# Patient Record
Sex: Male | Born: 1985 | Race: Black or African American | Hispanic: No | Marital: Single | Smoking: Current every day smoker
Health system: Southern US, Community
[De-identification: ages and names within clinical notes are randomized; demographics above are authoritative.]

## PROBLEM LIST (undated history)

## (undated) DIAGNOSIS — R569 Unspecified convulsions: Secondary | ICD-10-CM

## (undated) HISTORY — DX: Unspecified convulsions: R56.9

## (undated) HISTORY — PX: FRACTURE SURGERY: SHX138

---
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
## 2009-07-07 ENCOUNTER — Emergency Department (HOSPITAL_COMMUNITY): Admission: EM | Admit: 2009-07-07 | Discharge: 2009-07-07 | Payer: Self-pay | Admitting: Emergency Medicine

## 2009-07-09 ENCOUNTER — Emergency Department (HOSPITAL_COMMUNITY): Admission: EM | Admit: 2009-07-09 | Discharge: 2009-07-10 | Payer: Self-pay | Admitting: Emergency Medicine

## 2009-07-15 ENCOUNTER — Emergency Department (HOSPITAL_COMMUNITY): Admission: EM | Admit: 2009-07-15 | Discharge: 2009-07-15 | Payer: Self-pay | Admitting: Family Medicine

## 2009-07-29 ENCOUNTER — Emergency Department (HOSPITAL_COMMUNITY): Admission: EM | Admit: 2009-07-29 | Discharge: 2009-07-29 | Payer: Self-pay | Admitting: Emergency Medicine

## 2009-08-09 ENCOUNTER — Encounter: Admission: RE | Admit: 2009-08-09 | Discharge: 2009-08-09 | Payer: Self-pay | Admitting: Neurology

## 2010-08-10 LAB — POCT I-STAT, CHEM 8
Chloride: 108 mEq/L (ref 96–112)
Creatinine, Ser: 1.3 mg/dL (ref 0.4–1.5)
HCT: 45 % (ref 39.0–52.0)
TCO2: 20 mmol/L (ref 0–100)

## 2010-08-15 LAB — POCT I-STAT, CHEM 8
BUN: 12 mg/dL (ref 6–23)
Glucose, Bld: 66 mg/dL — ABNORMAL LOW (ref 70–99)
HCT: 48 % (ref 39.0–52.0)
Hemoglobin: 16.3 g/dL (ref 13.0–17.0)

## 2010-08-15 LAB — RAPID URINE DRUG SCREEN, HOSP PERFORMED
Barbiturates: NOT DETECTED
Cocaine: NOT DETECTED
Opiates: NOT DETECTED
Tetrahydrocannabinol: POSITIVE — AB

## 2010-10-07 NOTE — Op Note (Signed)
[place]  Patient:    HENDRICK, PAVICH Visit Number: [id number] MRN: [id number]          Service Type: MED Location: 1800 [id number] Attending Physician:  Tobey Bride Dictated by:   Artist Pais Mina Marble, M.D. Proc. Date: 06/05/01 Admit Date:  06/05/2001                             Operative Report  PREOPERATIVE DIAGNOSIS:  Fracture dislocation, right elbow.  POSTOPERATIVE DIAGNOSIS:  Fracture dislocation, right elbow.  PROCEDURES:  Open reduction and internal fixation of fracture dislocation of the right elbow with fixation of medial epicondylar fragment with two 2.7 mm mini fragment screws 24 mm in length.  SURGEONS:  Artist Pais. Mina Marble, M.D., and Elana Alm. Thurston Hole, M.D.  ANESTHESIA:  General.  TOURNIQUET TIME:  38 minutes.  COMPLICATIONS:  None.  DRAINS:  None.  DESCRIPTION OF PROCEDURE:  The patient was taken to the operating room.  After the induction of adequate general anesthesia, the right upper extremity was prepped and draped in the usual sterile fashion.  An Esmarch was used to exsanguinate the limb and the tourniquet was inflated to 250 mmHg.  At this point in time, a 6-7 mm incision was made over the medial aspect of the right elbow centered between the olecranon process and the medial epicondyle.  The incision was taken down through the skin and subcutaneous tissues with care to carefully identify and retract a large branch of the medial endobrachial cutaneous nerve.  Once this was done, the ulnar nerve was identified.  After this was done, the joint was entered with blunt dissection.  The capsule had been stripped off of the medial side of the elbow and there was a large medial epicondylar fragment with the collateral ligament attached to it interposed at the joint.  Gentle reduction was undertaken and the fragment was reduced out of the joint.  At this point in time, the joint was thoroughly irrigated of clot and  hematoma.  Once this was done, the medial epicondyle was provisonally fixed to the shaft of the distal humerus using an 0.45 K-wire.  The ulnar nerve was retracted during the entire process.  Once this was done, intraoperative x-rays showed good reduction in both the AP lateral and oblique views.  Next, a 2.7 mm mini fragment screw was placed just posterior to the 0.45 K-wire, 24 mm in length, drilled under direct vision.  The 0.45 K-wire was then removed and a second screw was placed in the same hole. Intraoperative x-rays then showed good reduction of the medial and epicondylar fragment in both the AP lateral and oblique views.  The wound was then thoroughly irrigated.  It was closed in layers with 2-0 Vicryl and then staples on the skin.  A sterile dressing of Xeroform, 4 x 4s, fluffs, compressive dressing, and elbow splint with the forearm in neutral, the elbow at 90 degrees, and the wrist in slight flexion, was applied.  The patient tolerated the procedure well and went to the recovery room in stable fashion. Dictated by:   Artist Pais Mina Marble, M.D. Attending Physician:  Tobey Bride DD:  06/05/01 TD:  06/05/01 Job: [id number]

## 2012-06-17 ENCOUNTER — Emergency Department (INDEPENDENT_AMBULATORY_CARE_PROVIDER_SITE_OTHER): Payer: 59

## 2012-06-17 ENCOUNTER — Emergency Department (INDEPENDENT_AMBULATORY_CARE_PROVIDER_SITE_OTHER)
Admission: EM | Admit: 2012-06-17 | Discharge: 2012-06-17 | Disposition: A | Discharge status: Home / Self Care | Payer: 59 | Source: Home / Self Care | Attending: Family Medicine | Admitting: Family Medicine

## 2012-06-17 ENCOUNTER — Encounter (HOSPITAL_COMMUNITY): Payer: Self-pay

## 2012-06-17 DIAGNOSIS — S62329A Displaced fracture of shaft of unspecified metacarpal bone, initial encounter for closed fracture: Secondary | ICD-10-CM

## 2012-06-17 DIAGNOSIS — S62326A Displaced fracture of shaft of fifth metacarpal bone, right hand, initial encounter for closed fracture: Secondary | ICD-10-CM

## 2012-06-17 MED ORDER — HYDROCODONE-ACETAMINOPHEN 5-500 MG PO TABS: 1.0000 | ORAL_TABLET | Freq: Four times a day (QID) | ORAL | Status: AC | PRN

## 2012-06-17 MED ORDER — IBUPROFEN 600 MG PO TABS: 600.0000 mg | ORAL_TABLET | Freq: Three times a day (TID) | ORAL | Status: DC | PRN

## 2012-06-17 MED ORDER — IBUPROFEN 800 MG PO TABS
ORAL_TABLET | ORAL | Status: AC
  Filled 2012-06-17: qty 1

## 2012-06-17 MED ORDER — HYDROCODONE-ACETAMINOPHEN 5-325 MG PO TABS
ORAL_TABLET | ORAL | Status: AC
  Filled 2012-06-17: qty 1

## 2012-06-17 MED ORDER — HYDROCODONE-ACETAMINOPHEN 5-325 MG PO TABS
1.0000 | ORAL_TABLET | Freq: Once | ORAL | Status: AC
  Administered 2012-06-17: 1 via ORAL

## 2012-06-17 MED ORDER — IBUPROFEN 800 MG PO TABS
800.0000 mg | ORAL_TABLET | Freq: Once | ORAL | Status: AC
  Administered 2012-06-17: 800 mg via ORAL

## 2012-06-17 NOTE — ED Notes (Signed)
States he struck right hand in wall when moving a friend

## 2012-06-17 NOTE — ED Provider Notes (Signed)
History     CSN: [id number]  Arrival date & time 06/17/12  1031   First MD Initiated Contact with Patient 06/17/12 1106      Chief Complaint  Patient presents with  . Hand Injury    (Consider location/radiation/quality/duration/timing/severity/associated sxs/prior treatment) HPI Comments: 27 year old right-handed male. Works at [employer] as a Merchandiser, retail. Here complaining of swelling and pain of the right hand. Patient reports that he sustained an injury 4 days ago when he was helping a friend to move and had his "right hand compressed between a refrigerator and a wall". Denies fist punching a wall a person or an object. Pain and swelling not improving reason he decided to come today to have his hand checked. Denies numbness, weakness or paresthesias in the right hand.   History reviewed. No pertinent past medical history.  History reviewed. No pertinent past surgical history.  History reviewed. No pertinent family history.  History  Substance Use Topics  . Smoking status: Not on file  . Smokeless tobacco: Not on file  . Alcohol Use: Not on file      Review of Systems  Musculoskeletal:       Right hand injury as per history of present illness  All other systems reviewed and are negative.    Allergies  Penicillins  Home Medications   Current Outpatient Rx  Name  Route  Sig  Dispense  Refill  . HYDROCODONE-ACETAMINOPHEN 5-500 MG PO TABS   Oral   Take 1 tablet by mouth every 6 (six) hours as needed for pain.   15 tablet   0   . IBUPROFEN 600 MG PO TABS   Oral   Take 1 tablet (600 mg total) by mouth every 8 (eight) hours as needed for pain.   20 tablet   0     BP 144/84  Pulse 62  Temp 97.9 F (36.6 C) (Oral)  Resp 16  SpO2 100%  Physical Exam  Nursing note and vitals reviewed. Constitutional: He is oriented to person, place, and time. He appears well-developed and well-nourished. No distress.  HENT:       No evidence of facial trauma.  Cardiovascular:  Normal heart sounds.   Pulmonary/Chest: Breath sounds normal.  Musculoskeletal:       Right hand: There is moderate swelling dorsally and laterally over the fifth metacarpal area. Area is also tender to palpation. Patient able to flex and extend fifth MP with limited range of motion with reported discomfort over metacarpal area. Patient can flex and extend PIP and PIP joints with no difficulties.  No obvious hematoma. Skin appears intact over fifth metacarpal area and fifth finger. There is a small superficial abrasion over third MP joint. Intact superficial sensation. Ulnar and radial pulses are also intact and patient has a brisk capillary refill in all digital pad including the fifth.    Neurological: He is alert and oriented to person, place, and time.  Skin: He is not diaphoretic.    ED Course  Procedures (including critical care time)  Labs Reviewed - No data to display Dg Hand Complete Right  06/17/2012  *RADIOLOGY REPORT*  Clinical Data: Persistent ulnar hand pain following injury 4 days ago.  RIGHT HAND - COMPLETE 3+ VIEW  Comparison: None.  Findings: There is a transverse fracture through the mid fifth metacarpal diaphysis associated with mild displacement and mild apex dorsal angulation.  There is no intra-articular extension.  No other fractures are identified.  There is no dislocation.  IMPRESSION: Mildly  displaced and angulated fracture of the mid fifth metacarpal.   Original Report Authenticated By: Carey Bullocks, M.D.      1. Closed displaced fracture of shaft of fifth metacarpal bone of right hand       MDM  27 year old male with right fifth mild angulated displaced metacarpal fracture from an injury that occurred 4 days ago.  Patient was placed in an ulnar gutter cast splint. Contacted Dr. Lenora Boys office (hand on call today). Patient will be seen next Thursday patient is to call to establish time of his appointment. Prescribed hydrocodone/acetaminophen and  ibuprofen. Supportive care and red flags should prompt his prompt return to medical attention discussed with patient and provided in writing.     Sharin Grave, MD 06/19/12 1209

## 2012-06-17 NOTE — ED Notes (Signed)
Ortho tech paged  

## 2012-06-17 NOTE — Progress Notes (Signed)
Orthopedic Tech Progress Note Patient Details:  Luke Taylor [birth year] [id number] Ulna gutter splint applied to Right UE with care instructions; tolerated well. Arm sling also supplied to patient and applied.   Ortho Devices Type of Ortho Device: Arm sling;Ulna gutter splint Ortho Device/Splint Location: Right UE Ortho Device/Splint Interventions: Application   Asia R Thompson 06/17/2012, 1:42 PM

## 2013-08-11 ENCOUNTER — Encounter (HOSPITAL_COMMUNITY): Payer: Self-pay | Admitting: Emergency Medicine

## 2013-08-11 ENCOUNTER — Emergency Department (INDEPENDENT_AMBULATORY_CARE_PROVIDER_SITE_OTHER)
Admission: EM | Admit: 2013-08-11 | Discharge: 2013-08-11 | Disposition: A | Discharge status: Home / Self Care | Payer: 59 | Source: Home / Self Care | Attending: Family Medicine | Admitting: Family Medicine

## 2013-08-11 DIAGNOSIS — R040 Epistaxis: Secondary | ICD-10-CM

## 2013-08-11 MED ORDER — CETIRIZINE HCL 10 MG PO TABS: 10.0000 mg | ORAL_TABLET | Freq: Every day | ORAL | Status: DC

## 2013-08-11 NOTE — Discharge Instructions (Signed)
Use medicine as instructed, return as needed if problem recurs

## 2013-08-11 NOTE — ED Notes (Signed)
C/o episodic nosebleed x 2-3 days, none at present

## 2013-08-11 NOTE — ED Provider Notes (Addendum)
CSN: [id number]     Arrival date & time 08/11/13  1225 History   None    Chief Complaint  Patient presents with  . Epistaxis   (Consider location/radiation/quality/duration/timing/severity/associated sxs/prior Treatment) Patient is a 28 y.o. male presenting with nosebleeds.  Epistaxis Location:  L nare Severity:  Mild Duration:  3 days Timing:  Intermittent Progression:  Waxing and waning Chronicity:  Recurrent Context: weather change   Associated symptoms: congestion   Risk factors: allergies and frequent nosebleeds     History reviewed. No pertinent past medical history. History reviewed. No pertinent past surgical history. History reviewed. No pertinent family history. History  Substance Use Topics  . Smoking status: Never Smoker   . Smokeless tobacco: Not on file  . Alcohol Use: Not on file    Review of Systems  Constitutional: Negative.   HENT: Positive for congestion and nosebleeds. Negative for postnasal drip and rhinorrhea.     Allergies  Penicillins  Home Medications   Current Outpatient Rx  Name  Route  Sig  Dispense  Refill  . cetirizine (ZYRTEC) 10 MG tablet   Oral   Take 1 tablet (10 mg total) by mouth daily. One tab daily for allergies   30 tablet   1   . HYDROcodone-acetaminophen (VICODIN) 5-500 MG per tablet   Oral   Take 1 tablet by mouth every 6 (six) hours as needed for pain.   15 tablet   0   . ibuprofen (ADVIL,MOTRIN) 600 MG tablet   Oral   Take 1 tablet (600 mg total) by mouth every 8 (eight) hours as needed for pain.   20 tablet   0    BP 137/84  Pulse 60  Temp(Src) 97.8 F (36.6 C) (Oral)  Resp 14  SpO2 100% Physical Exam  Nursing note and vitals reviewed. Constitutional: He is oriented to person, place, and time. He appears well-developed and well-nourished.  HENT:  Head: Normocephalic.  Right Ear: External ear normal.  Left Ear: External ear normal.  Nose: No mucosal edema, rhinorrhea or nasal septal hematoma. No  epistaxis.  Mouth/Throat: Oropharynx is clear and moist.  Neck: Normal range of motion. Neck supple.  Lymphadenopathy:    He has no cervical adenopathy.  Neurological: He is alert and oriented to person, place, and time.  Skin: Skin is warm and dry.    ED Course  Procedures (including critical care time) Labs Review Labs Reviewed - No data to display Imaging Review No results found.   MDM   1. Left-sided epistaxis    Afrin spray and bacitracin applied to nose.   Linna HoffJames D Lynnell Fiumara, MD 08/11/13 1444  Linna HoffJames D Benjamyn Hestand, MD 08/12/13 [phone]

## 2016-06-06 ENCOUNTER — Ambulatory Visit (INDEPENDENT_AMBULATORY_CARE_PROVIDER_SITE_OTHER): Payer: BLUE CROSS/BLUE SHIELD | Admitting: Physician Assistant

## 2016-06-06 VITALS — BP 136/82 | HR 68 | Temp 98.4°F | Resp 18 | Ht 70.0 in | Wt 180.0 lb

## 2016-06-06 DIAGNOSIS — R5383 Other fatigue: Secondary | ICD-10-CM | POA: Diagnosis not present

## 2016-06-06 DIAGNOSIS — R9431 Abnormal electrocardiogram [ECG] [EKG]: Secondary | ICD-10-CM

## 2016-06-06 DIAGNOSIS — Z87898 Personal history of other specified conditions: Secondary | ICD-10-CM

## 2016-06-06 DIAGNOSIS — R42 Dizziness and giddiness: Secondary | ICD-10-CM | POA: Diagnosis not present

## 2016-06-06 LAB — POC MICROSCOPIC URINALYSIS (UMFC): Mucus: ABSENT

## 2016-06-06 LAB — POCT URINALYSIS DIP (MANUAL ENTRY)
Blood, UA: NEGATIVE
Glucose, UA: NEGATIVE
Leukocytes, UA: NEGATIVE
Nitrite, UA: NEGATIVE
Protein Ur, POC: 30 — AB
Spec Grav, UA: >=1.03
Urobilinogen, UA: 2
pH, UA: 6

## 2016-06-06 NOTE — Progress Notes (Signed)
Luke Taylor  MRN: [id number] DOB: Jul 10, 1985  PCP: No primary care provider on file.  Subjective:  Pt is a 31 year old male who presents to clinic for dizziness and fatigue.  He got dizzy at work today and felt numbness in his legs. He took his blood pressure, 178/93.  Dizziness - He was not doing any physical labor. His sight was "a little dim". He has not eating anything at the time. He normally does not eat breakfast, but usually eats lunch. "felt like I was about to fall over."  Denies being sick recently. No fever, chills No new medications.  Drinks about 4 20-oz bottles of water a day. Didn't sleep well last night - he was up talking with his girlfriend until 1am. Woke up at 5 am.  Right now he is feeling better. Feels a little weak.  Denies chest pain, palpitations, headache.   Blood pressure today in office 136/82.  History of seizure - One episode in 2011. Does not take any medications for this. At the time he was taking energy drinks and not sleeping. He saw neurologist for about 6 months. Has not had seizure since that time.   Recent life stressor - Father recently passed away due to complications of alcohol and seizure medications.  Family history of diabetes - maternal grandfather.   Review of Systems  Constitutional: Positive for fatigue. Negative for chills, diaphoresis and fever.  Respiratory: Negative for cough, chest tightness, shortness of breath and wheezing.   Cardiovascular: Negative for chest pain, palpitations and leg swelling.  Gastrointestinal: Negative for diarrhea, nausea and vomiting.  Musculoskeletal: Negative for neck pain.  Neurological: Positive for dizziness. Negative for syncope, light-headedness and headaches.  Psychiatric/Behavioral: Negative for sleep disturbance. The patient is not nervous/anxious.     There are no active problems to display for this patient.   Current Outpatient Prescriptions on File Prior to Visit  Medication Sig  Dispense Refill  . HYDROcodone-acetaminophen (VICODIN) 5-500 MG per tablet Take 1 tablet by mouth every 6 (six) hours as needed for pain. (Patient not taking: Reported on 06/06/2016) 15 tablet 0   No current facility-administered medications on file prior to visit.     Allergies  Allergen Reactions  . Penicillins      Objective:  BP 136/82 (BP Location: Right Arm, Patient Position: Sitting, Cuff Size: Small)   Pulse 68   Temp 98.4 F (36.9 C) (Oral)   Resp 18   Ht _0  (1.778 m)   Wt 180 lb (81.6 kg)   SpO2 99%   BMI 25.83 kg/m   Physical Exam  Constitutional: He is oriented to person, place, and time and well-developed, well-nourished, and in no distress. No distress.  Eyes: Conjunctivae are normal. Pupils are equal, round, and reactive to light. Right eye exhibits nystagmus. Left eye exhibits nystagmus.  Cardiovascular: Normal rate, regular rhythm and normal heart sounds.   No murmur heard. Pulmonary/Chest: Effort normal and breath sounds normal.  Neurological: He is alert and oriented to person, place, and time. He has normal motor skills, normal sensation, normal strength and normal reflexes. No sensory deficit. GCS score is 15.  Skin: Skin is warm and dry.  Psychiatric: Mood, memory, affect and judgment normal.  Vitals reviewed.  EKG:  short P-R interval.  Assessment and Plan :  This case was discussed with Dr. Carlota Raspberry.  1. Dizziness 2. Fatigue, unspecified type 3. Nonspecific abnormal electrocardiogram (ECG) (EKG) - TSH - EKG 12-Lead - CMP14+EGFR - POCT urinalysis  dipstick - POCT Microscopic Urinalysis ([facility]) - Hemoglobin A1c - CBC with Differential/Platelet - Ambulatory referral to Cardiology  4. History of seizure - Ambulatory referral to Neurology - Suspect possible situational dizziness due to dehydration, lack of sleep and increased family stress. No concerning findings on physical exam. Due to pt's abnormal EKG and history of seizure, will refer for  further work-up. Discussed this with pt. Discussed healthy lifestyle and food choices. He understands and agrees.     Mercer Pod, PA-C  [facility] 06/06/2016 3:05 PM

## 2016-06-06 NOTE — Patient Instructions (Signed)
Your urine shows that you are dehydrated.  Please drink at least 2-3 liters of water a day. Cut down on the amount of sugary drinks you have. Please try to eat a healthy diet and bring snacks with you. Eat several small meals a day.   Cardiology and neurology will call you to set up an appointment.  Please call the clinic if you have any questions. I will mail you your labs results when they come back.   Thank you for coming in today. I hope you feel we met your needs.  Feel free to call [facility] if you have any questions or further requests.  Please consider signing up for MyChart if you do not already have it, as this is a great way to communicate with me.  Best,  Whitney Saladin Petrelli, PA-C   Heart-Healthy Eating Plan Many factors influence your heart health, including eating and exercise habits. Heart (coronary) risk increases with abnormal blood fat (lipid) levels. Heart-healthy meal planning includes limiting unhealthy fats, increasing healthy fats, and making other small dietary changes. This includes maintaining a healthy body weight to help keep lipid levels within a normal range. What is my plan? Your health care provider recommends that you:  Get no more than _________% of the total calories in your daily diet from fat.  Limit your intake of saturated fat to less than _________% of your total calories each day.  Limit the amount of cholesterol in your diet to less than _________ mg per day. What types of fat should I choose?  Choose healthy fats more often. Choose monounsaturated and polyunsaturated fats, such as olive oil and canola oil, flaxseeds, walnuts, almonds, and seeds.  Eat more omega-3 fats. Good choices include salmon, mackerel, sardines, tuna, flaxseed oil, and ground flaxseeds. Aim to eat fish at least two times each week.  Limit saturated fats. Saturated fats are primarily found in animal products, such as meats, butter, and cream. Plant sources of saturated fats include  palm oil, palm kernel oil, and coconut oil.  Avoid foods with partially hydrogenated oils in them. These contain trans fats. Examples of foods that contain trans fats are stick margarine, some tub margarines, cookies, crackers, and other baked goods. What general guidelines do I need to follow?  Check food labels carefully to identify foods with trans fats or high amounts of saturated fat.  Fill one half of your plate with vegetables and green salads. Eat 4-5 servings of vegetables per day. A serving of vegetables equals 1 cup of raw leafy vegetables,  cup of raw or cooked cut-up vegetables, or  cup of vegetable juice.  Fill one fourth of your plate with whole grains. Look for the word "whole" as the first word in the ingredient list.  Fill one fourth of your plate with lean protein foods.  Eat 4-5 servings of fruit per day. A serving of fruit equals one medium whole fruit,  cup of dried fruit,  cup of fresh, frozen, or canned fruit, or  cup of 100% fruit juice.  Eat more foods that contain soluble fiber. Examples of foods that contain this type of fiber are apples, broccoli, carrots, beans, peas, and barley. Aim to get 20-30 g of fiber per day.  Eat more home-cooked food and less restaurant, buffet, and fast food.  Limit or avoid alcohol.  Limit foods that are high in starch and sugar.  Avoid fried foods.  Cook foods by using methods other than frying. Baking, boiling, grilling, and broiling are  all great options. Other fat-reducing suggestions include:  Removing the skin from poultry.  Removing all visible fats from meats.  Skimming the fat off of stews, soups, and gravies before serving them.  Steaming vegetables in water or broth.  Lose weight if you are overweight. Losing just 5-10% of your initial body weight can help your overall health and prevent diseases such as diabetes and heart disease.  Increase your consumption of nuts, legumes, and seeds to 4-5 servings per  week. One serving of dried beans or legumes equals  cup after being cooked, one serving of nuts equals 1 ounces, and one serving of seeds equals  ounce or 1 tablespoon.  You may need to monitor your salt (sodium) intake, especially if you have high blood pressure. Talk with your health care provider or dietitian to get more information about reducing sodium. What foods can I eat? Grains  Breads, including [place], white, pita, wheat, raisin, rye, oatmeal, and [place]. Tortillas that are neither fried nor made with lard or trans fat. Low-fat rolls, including hotdog and hamburger buns and English muffins. Biscuits. Muffins. Waffles. Pancakes. Light popcorn. Whole-grain cereals. Flatbread. Melba toast. Pretzels. Breadsticks. Rusks. Low-fat snacks and crackers, including oyster, saltine, matzo, graham, animal, and rye. Rice and pasta, including brown rice and those that are made with whole wheat. Vegetables  All vegetables. Fruits  All fruits, but limit coconut. Meats and Other Protein Sources  Lean, well-trimmed beef, veal, pork, and lamb. Chicken and [place] without skin. All fish and shellfish. Wild duck, rabbit, pheasant, and venison. Egg whites or low-cholesterol egg substitutes. Dried beans, peas, lentils, and tofu.Seeds and most nuts. Dairy  Low-fat or nonfat cheeses, including ricotta, string, and mozzarella. Skim or 1% milk that is liquid, powdered, or evaporated. Buttermilk that is made with low-fat milk. Nonfat or low-fat yogurt. Beverages  Mineral water. Diet carbonated beverages. Sweets and Desserts  Sherbets and fruit ices. Honey, jam, marmalade, jelly, and syrups. Meringues and gelatins. Pure sugar candy, such as hard candy, jelly beans, gumdrops, mints, marshmallows, and small amounts of dark chocolate. [employer] Eat all sweets and desserts in moderation. Fats and Oils  Nonhydrogenated (trans-free) margarines. Vegetable oils, including soybean, sesame, sunflower, olive,  peanut, safflower, corn, canola, and cottonseed. Salad dressings or mayonnaise that are made with a vegetable oil. Limit added fats and oils that you use for cooking, baking, salads, and as spreads. Other  Cocoa powder. Coffee and tea. All seasonings and condiments. The items listed above may not be a complete list of recommended foods or beverages. Contact your dietitian for more options.  What foods are not recommended? Grains  Breads that are made with saturated or trans fats, oils, or whole milk. Croissants. Butter rolls. Cheese breads. Sweet rolls. Donuts. Buttered popcorn. Chow mein noodles. High-fat crackers, such as cheese or butter crackers. Meats and Other Protein Sources  Fatty meats, such as hotdogs, short ribs, sausage, spareribs, bacon, ribeye roast or steak, and mutton. High-fat deli meats, such as salami and bologna. Caviar. Domestic duck and goose. Organ meats, such as kidney, liver, sweetbreads, brains, gizzard, chitterlings, and heart. Dairy  Cream, sour cream, cream cheese, and creamed cottage cheese. Whole milk cheeses, including blue (bleu), Monte[place] Jack, [place], [place], American, [place], Swiss, [place], [place], and [place]. Whole or 2% milk that is liquid, evaporated, or condensed. Whole buttermilk. Cream sauce or high-fat cheese sauce. Yogurt that is made from whole milk. Beverages  Regular sodas and drinks with added sugar. Sweets and Desserts  Frosting.  Pudding. Cookies. Cakes other than angel food cake. Candy that has milk chocolate or white chocolate, hydrogenated fat, butter, coconut, or unknown ingredients. Buttered syrups. Full-fat ice cream or ice cream drinks. Fats and Oils  Gravy that has suet, meat fat, or shortening. Cocoa butter, hydrogenated oils, palm oil, coconut oil, palm kernel oil. These can often be found in baked products, candy, fried foods, nondairy creamers, and whipped toppings. Solid fats and shortenings, including bacon fat, salt pork, lard, and  butter. Nondairy cream substitutes, such as coffee creamers and sour cream substitutes. Salad dressings that are made of unknown oils, cheese, or sour cream. The items listed above may not be a complete list of foods and beverages to avoid. Contact your dietitian for more information.  This information is not intended to replace advice given to you by your health care provider. Make sure you discuss any questions you have with your health care provider. Document Released: 02/15/2008 Document Revised: 11/26/2015 Document Reviewed: 10/30/2013 Elsevier Interactive Patient Education  2017 [employer].

## 2016-06-07 LAB — CBC WITH DIFFERENTIAL/PLATELET
Basophils Absolute: 0 10*3/uL (ref 0.0–0.2)
Basos: 0 %
EOS (ABSOLUTE): 0.1 10*3/uL (ref 0.0–0.4)
Eos: 1 %
Hematocrit: 43.6 % (ref 37.5–51.0)
Hemoglobin: 14.7 g/dL (ref 13.0–17.7)
Immature Grans (Abs): 0 10*3/uL (ref 0.0–0.1)
Immature Granulocytes: 0 %
Lymphocytes Absolute: 1.8 10*3/uL (ref 0.7–3.1)
Lymphs: 37 %
MCH: 27.5 pg (ref 26.6–33.0)
MCHC: 33.7 g/dL (ref 31.5–35.7)
MCV: 82 fL (ref 79–97)
Monocytes Absolute: 0.5 10*3/uL (ref 0.1–0.9)
Monocytes: 10 %
Neutrophils Absolute: 2.6 10*3/uL (ref 1.4–7.0)
Neutrophils: 52 %
Platelets: 344 10*3/uL (ref 150–379)
RBC: 5.34 x10E6/uL (ref 4.14–5.80)
RDW: 14.4 % (ref 12.3–15.4)
WBC: 4.9 10*3/uL (ref 3.4–10.8)

## 2016-06-07 LAB — CMP14+EGFR
ALT: 19 IU/L (ref 0–44)
AST: 21 IU/L (ref 0–40)
Albumin/Globulin Ratio: 2.1 (ref 1.2–2.2)
Albumin: 5 g/dL (ref 3.5–5.5)
Alkaline Phosphatase: 74 IU/L (ref 39–117)
BUN/Creatinine Ratio: 13 (ref 9–20)
BUN: 16 mg/dL (ref 6–20)
Bilirubin Total: 1.2 mg/dL (ref 0.0–1.2)
CO2: 23 mmol/L (ref 18–29)
Calcium: 9.5 mg/dL (ref 8.7–10.2)
Chloride: 103 mmol/L (ref 96–106)
Creatinine, Ser: 1.19 mg/dL (ref 0.76–1.27)
GFR calc Af Amer: 95 mL/min/{1.73_m2} (ref >59)
GFR calc non Af Amer: 82 mL/min/{1.73_m2} (ref >59)
Globulin, Total: 2.4 g/dL (ref 1.5–4.5)
Glucose: 74 mg/dL (ref 65–99)
Potassium: 4.4 mmol/L (ref 3.5–5.2)
Sodium: 145 mmol/L — ABNORMAL HIGH (ref 134–144)
Total Protein: 7.4 g/dL (ref 6.0–8.5)

## 2016-06-07 LAB — HEMOGLOBIN A1C
Est. average glucose Bld gHb Est-mCnc: 100 mg/dL
Hgb A1c MFr Bld: 5.1 % (ref 4.8–5.6)

## 2016-06-07 LAB — TSH: TSH: 0.972 u[IU]/mL (ref 0.450–4.500)

## 2016-06-14 ENCOUNTER — Encounter: Payer: Self-pay | Admitting: Physician Assistant

## 2017-01-01 ENCOUNTER — Encounter (HOSPITAL_COMMUNITY): Payer: Self-pay | Admitting: Emergency Medicine

## 2017-01-01 ENCOUNTER — Emergency Department (HOSPITAL_COMMUNITY)
Admission: EM | Admit: 2017-01-01 | Discharge: 2017-01-01 | Disposition: A | Discharge status: Home / Self Care | Payer: BLUE CROSS/BLUE SHIELD | Attending: Emergency Medicine | Admitting: Emergency Medicine

## 2017-01-01 ENCOUNTER — Emergency Department (HOSPITAL_COMMUNITY): Payer: BLUE CROSS/BLUE SHIELD

## 2017-01-01 DIAGNOSIS — R0602 Shortness of breath: Secondary | ICD-10-CM | POA: Insufficient documentation

## 2017-01-01 DIAGNOSIS — M545 Low back pain: Secondary | ICD-10-CM | POA: Diagnosis present

## 2017-01-01 DIAGNOSIS — R05 Cough: Secondary | ICD-10-CM | POA: Diagnosis not present

## 2017-01-01 DIAGNOSIS — F17200 Nicotine dependence, unspecified, uncomplicated: Secondary | ICD-10-CM | POA: Insufficient documentation

## 2017-01-01 DIAGNOSIS — M62838 Other muscle spasm: Secondary | ICD-10-CM

## 2017-01-01 LAB — CBC WITH DIFFERENTIAL/PLATELET
Basophils Absolute: 0 10*3/uL (ref 0.0–0.1)
Basophils Relative: 0 %
EOS PCT: 1 %
Eosinophils Absolute: 0.1 10*3/uL (ref 0.0–0.7)
HEMATOCRIT: 42.3 % (ref 39.0–52.0)
Hemoglobin: 14.6 g/dL (ref 13.0–17.0)
LYMPHS ABS: 1.2 10*3/uL (ref 0.7–4.0)
LYMPHS PCT: 13 %
MCH: 27.8 pg (ref 26.0–34.0)
MCHC: 34.5 g/dL (ref 30.0–36.0)
MCV: 80.4 fL (ref 78.0–100.0)
MONO ABS: 0.3 10*3/uL (ref 0.1–1.0)
Monocytes Relative: 3 %
NEUTROS ABS: 7.4 10*3/uL (ref 1.7–7.7)
Neutrophils Relative %: 83 %
PLATELETS: 281 10*3/uL (ref 150–400)
RBC: 5.26 MIL/uL (ref 4.22–5.81)
RDW: 13.9 % (ref 11.5–15.5)
WBC: 9 10*3/uL (ref 4.0–10.5)

## 2017-01-01 LAB — D-DIMER, QUANTITATIVE: D-Dimer, Quant: <0.27 ug/mL-FEU (ref 0.00–0.50)

## 2017-01-01 LAB — BASIC METABOLIC PANEL
Anion gap: 8 (ref 5–15)
BUN: 9 mg/dL (ref 6–20)
CO2: 26 mmol/L (ref 22–32)
Calcium: 9.3 mg/dL (ref 8.9–10.3)
Chloride: 106 mmol/L (ref 101–111)
Creatinine, Ser: 1.21 mg/dL (ref 0.61–1.24)
GFR calc Af Amer: >60 mL/min (ref >60)
Glucose, Bld: 87 mg/dL (ref 65–99)
POTASSIUM: 4.2 mmol/L (ref 3.5–5.1)
Sodium: 140 mmol/L (ref 135–145)

## 2017-01-01 LAB — URINALYSIS, ROUTINE W REFLEX MICROSCOPIC
BILIRUBIN URINE: NEGATIVE
Bacteria, UA: NONE SEEN
GLUCOSE, UA: NEGATIVE mg/dL
HGB URINE DIPSTICK: NEGATIVE
Ketones, ur: NEGATIVE mg/dL
Leukocytes, UA: NEGATIVE
NITRITE: NEGATIVE
PH: 9 — AB (ref 5.0–8.0)
Protein, ur: 30 mg/dL — AB
SPECIFIC GRAVITY, URINE: 1.018 (ref 1.005–1.030)
Squamous Epithelial / LPF: NONE SEEN

## 2017-01-01 LAB — TROPONIN I: Troponin I: <0.03 ng/mL (ref <0.03)

## 2017-01-01 MED ORDER — CYCLOBENZAPRINE HCL 5 MG PO TABS: 5.0000 mg | ORAL_TABLET | Freq: Two times a day (BID) | ORAL | 0 refills | Status: AC | PRN

## 2017-01-01 MED ORDER — IBUPROFEN 800 MG PO TABS: 800.0000 mg | ORAL_TABLET | Freq: Three times a day (TID) | ORAL | 0 refills | Status: AC

## 2017-01-01 MED ORDER — DIAZEPAM 5 MG PO TABS
5.0000 mg | ORAL_TABLET | Freq: Once | ORAL | Status: AC
  Administered 2017-01-01: 5 mg via ORAL
  Filled 2017-01-01: qty 1

## 2017-01-01 MED ORDER — HYDROCODONE-ACETAMINOPHEN 5-325 MG PO TABS
1.0000 | ORAL_TABLET | Freq: Once | ORAL | Status: AC
  Administered 2017-01-01: 1 via ORAL
  Filled 2017-01-01: qty 1

## 2017-01-01 MED ORDER — KETOROLAC TROMETHAMINE 30 MG/ML IJ SOLN
30.0000 mg | Freq: Once | INTRAMUSCULAR | Status: AC
  Administered 2017-01-01: 30 mg via INTRAMUSCULAR
  Filled 2017-01-01: qty 1

## 2017-01-01 NOTE — ED Provider Notes (Signed)
[facility]-EMERGENCY DEPT Provider Note   CSN: [id number] Arrival date & time: 01/01/17  0515     History   Chief Complaint Chief Complaint  Patient presents with  . Shortness of Breath  . Flank Pain    HPI Luke Taylor is a 31 y.o. male.  Patient presents with a severe right sided mid back pain that became worse this morning after coughing. States he had a twisting injury several days ago with some pain in his right mid back that became acutely worse today when he woke up from sleep coughing. Associated with shortness of breath and pain with deep breathing. Denies any lifting injury or direct trauma. Denies chest pain. Denies any cough or fever. Denies abdominal pain nausea or vomiting. Denies any weakness in his arm, numbness or tingling. He did not take anything this morning for pain. Denies any previous history of back problems. No bowel or bladder incontinence.   The history is provided by the patient.  Shortness of Breath  Pertinent negatives include no fever, no headaches, no rhinorrhea, no wheezing, no chest pain, no vomiting, no abdominal pain and no leg swelling.  Flank Pain  Associated symptoms include shortness of breath. Pertinent negatives include no chest pain, no abdominal pain and no headaches.    Past Medical History:  Diagnosis Date  . Seizures (HCC)     There are no active problems to display for this patient.   Past Surgical History:  Procedure Laterality Date  . FRACTURE SURGERY         Home Medications    Prior to Admission medications   Medication Sig Start Date End Date Taking? Authorizing Provider  HYDROcodone-acetaminophen (VICODIN) 5-500 MG per tablet Take 1 tablet by mouth every 6 (six) hours as needed for pain. Patient not taking: Reported on 06/06/2016 06/17/12   Moreno-Coll, Christin Fudge, MD    Family History Family History  Problem Relation Age of Onset  . Diabetes Maternal Grandfather   . Heart disease Maternal Grandfather   .  Hyperlipidemia Maternal Grandfather   . Stroke Maternal Grandfather     Social History Social History  Substance Use Topics  . Smoking status: Current Every Day Smoker  . Smokeless tobacco: Never Used  . Alcohol use No     Allergies   Penicillins   Review of Systems Review of Systems  Constitutional: Negative for activity change, appetite change, fatigue and fever.  HENT: Negative for congestion and rhinorrhea.   Eyes: Negative for visual disturbance.  Respiratory: Positive for shortness of breath. Negative for chest tightness and wheezing.   Cardiovascular: Negative for chest pain and leg swelling.  Gastrointestinal: Negative for abdominal pain, nausea and vomiting.  Genitourinary: Positive for flank pain. Negative for testicular pain.  Musculoskeletal: Positive for back pain.  Neurological: Negative for dizziness, weakness and headaches.    all other systems are negative except as noted in the HPI and PMH.    Physical Exam Updated Vital Signs BP (!) 155/100 (BP Location: Right Arm)   Pulse 85   Temp 98 F (36.7 C) (Oral)   Resp 18   SpO2 98%   Physical Exam  Constitutional: He is oriented to person, place, and time. He appears well-developed and well-nourished. No distress.  HENT:  Head: Normocephalic and atraumatic.  Mouth/Throat: Oropharynx is clear and moist. No oropharyngeal exudate.  Eyes: Pupils are equal, round, and reactive to light. Conjunctivae and EOM are normal.  Neck: Normal range of motion. Neck supple.  No meningismus.  Cardiovascular:  Normal rate, regular rhythm, normal heart sounds and intact distal pulses.   No murmur heard. Pulmonary/Chest: Effort normal and breath sounds normal. No respiratory distress.  Equal breath sounds bilaterally. There is right thoracic paraspinal muscle tenderness and spasm.  Abdominal: Soft. There is no tenderness. There is no rebound and no guarding.  Musculoskeletal: Normal range of motion. He exhibits no edema or  tenderness.  Equal grip strength   Neurological: He is alert and oriented to person, place, and time. No cranial nerve deficit. He exhibits normal muscle tone. Coordination normal.  No ataxia on finger to nose bilaterally. No pronator drift. 5/5 strength throughout. CN 2-12 intact.Equal grip strength. Sensation intact.   Skin: Skin is warm.  Psychiatric: He has a normal mood and affect. His behavior is normal.  Nursing note and vitals reviewed.    ED Treatments / Results  Labs (all labs ordered are listed, but only abnormal results are displayed) Labs Reviewed  URINALYSIS, ROUTINE W REFLEX MICROSCOPIC - Abnormal; Notable for the following:       Result Value   pH 9.0 (*)    Protein, ur 30 (*)    All other components within normal limits  CBC WITH DIFFERENTIAL/PLATELET  BASIC METABOLIC PANEL  TROPONIN I  D-DIMER, QUANTITATIVE (NOT AT [facility])    EKG  EKG Interpretation  Date/Time:  Monday January 01 2017 05:28:01 EDT Ventricular Rate:  78 PR Interval:    QRS Duration: 91 QT Interval:  372 QTC Calculation: 424 R Axis:   64 Text Interpretation:  Sinus rhythm RSR' in V1 or V2, probably normal variant No previous ECGs available Confirmed by Glynn Octaveancour, Italo Banton [phone]([id number]) on 01/01/2017 5:45:32 AM       Radiology Dg Chest 2 View  Result Date: 01/01/2017 CLINICAL DATA:  Twisting injury, with acute onset of right axillary pain. Initial encounter. EXAM: CHEST  2 VIEW COMPARISON:  Chest radiograph performed 08/09/2009 FINDINGS: The lungs are well-aerated and clear. There is no evidence of focal opacification, pleural effusion or pneumothorax. The heart is normal in size; the mediastinal contour is within normal limits. No acute osseous abnormalities are seen. IMPRESSION: No acute cardiopulmonary process seen. Electronically Signed   By: Roanna RaiderJeffery  Chang M.D.   On: 01/01/2017 05:53    Procedures Procedures (including critical care time)  Medications Ordered in ED Medications - No data to  display   Initial Impression / Assessment and Plan / ED Course  I have reviewed the triage vital signs and the nursing notes.  Pertinent labs & imaging results that were available during my care of the patient were reviewed by me and considered in my medical decision making (see chart for details).     R paraspinal midback pain, worse after coughing. No hypoxia. Equal breath sounds.  CXR negative for PTX. Labs reassuring. D-dimer negative.  Doubt ACS, PE, aortic dissection. BP elevated due to pain.  Pain worse with palpation and arm movement. Doubt PE or aortic dissection. Will treat supportively for muscle strain/spasm. Follow up with PCP. Return precautions discussed. Final Clinical Impressions(s) / ED Diagnoses   Final diagnoses:  Muscle spasm    New Prescriptions New Prescriptions   No medications on file     Glynn Octaveancour, Shaquandra Galano, MD 01/01/17 [phone]

## 2017-01-01 NOTE — Discharge Instructions (Signed)
Your chest xray is negative. Testing is negative for heart attack or blood clot in the lung. Follow up with a primary doctor. Return to the ED if you develop new or worsening symptoms.

## 2017-01-01 NOTE — ED Triage Notes (Signed)
Patient here from home, states that he pulled a muscle in his back on the right side a few days ago.  Patient was at home this morning, coughed and had severe pain in his right side and back, with some shortness of breath.

## 2019-08-02 ENCOUNTER — Other Ambulatory Visit: Payer: Self-pay

## 2019-08-02 ENCOUNTER — Encounter (HOSPITAL_COMMUNITY): Payer: Self-pay

## 2019-08-02 ENCOUNTER — Emergency Department (HOSPITAL_COMMUNITY)
Admission: EM | Admit: 2019-08-02 | Discharge: 2019-08-02 | Disposition: A | Discharge status: Home / Self Care | Payer: 59 | Attending: Emergency Medicine | Admitting: Emergency Medicine

## 2019-08-02 ENCOUNTER — Emergency Department (HOSPITAL_COMMUNITY): Payer: 59

## 2019-08-02 DIAGNOSIS — M79641 Pain in right hand: Secondary | ICD-10-CM | POA: Diagnosis present

## 2019-08-02 DIAGNOSIS — F17200 Nicotine dependence, unspecified, uncomplicated: Secondary | ICD-10-CM | POA: Diagnosis not present

## 2019-08-02 DIAGNOSIS — X500XXA Overexertion from strenuous movement or load, initial encounter: Secondary | ICD-10-CM | POA: Diagnosis not present

## 2019-08-02 DIAGNOSIS — Z79899 Other long term (current) drug therapy: Secondary | ICD-10-CM | POA: Diagnosis not present

## 2019-08-02 DIAGNOSIS — Y9389 Activity, other specified: Secondary | ICD-10-CM | POA: Insufficient documentation

## 2019-08-02 MED ORDER — NAPROXEN 500 MG PO TABS: 500.0000 mg | ORAL_TABLET | Freq: Two times a day (BID) | ORAL | 0 refills | Status: AC

## 2019-08-02 NOTE — ED Notes (Signed)
Patient transported to x-ray. ?

## 2019-08-02 NOTE — ED Provider Notes (Signed)
[facility]-EMERGENCY DEPT Provider Note   CSN: [id number] Arrival date & time: 08/02/19  2103     History Chief Complaint  Patient presents with  . Hand Pain    Luke Taylor is a 34 y.o. male presenting for evaluation of right hand pain.  Patient states 1 week ago he was lifting furniture approximately 150 pounds when he felt something pop in the dorsal aspect of his right hand.  Since then, he has been having pain of his right hand.  It is worse with movement and lifting.  Minimal to no pain at rest.  Has been taking Tylenol with mild improvement of symptoms.  He has not tried anything else.  He denies numbness or tingling.  He denies significant swelling.  Patient is here today as pain is not improving even after a week.  He has no other medical problems, takes medications daily.  He reports approximately 10 years ago he had surgery of this hand, has not had issues since.  HPI     Past Medical History:  Diagnosis Date  . Seizures (HCC)     There are no problems to display for this patient.   Past Surgical History:  Procedure Laterality Date  . FRACTURE SURGERY         Family History  Problem Relation Age of Onset  . Diabetes Maternal Grandfather   . Heart disease Maternal Grandfather   . Hyperlipidemia Maternal Grandfather   . Stroke Maternal Grandfather     Social History   Tobacco Use  . Smoking status: Current Every Day Smoker  . Smokeless tobacco: Never Used  Substance Use Topics  . Alcohol use: No  . Drug use: No    Home Medications Prior to Admission medications   Medication Sig Start Date End Date Taking? Authorizing Provider  cyclobenzaprine (FLEXERIL) 5 MG tablet Take 1 tablet (5 mg total) by mouth 2 (two) times daily as needed for muscle spasms. 01/01/17   Rancour, Jeannett Senior, MD  HYDROcodone-acetaminophen (VICODIN) 5-500 MG per tablet Take 1 tablet by mouth every 6 (six) hours as needed for pain. Patient not taking: Reported  on 06/06/2016 06/17/12   Moreno-Coll, Adlih, MD  ibuprofen (ADVIL,MOTRIN) 800 MG tablet Take 1 tablet (800 mg total) by mouth 3 (three) times daily. 01/01/17   Rancour, Jeannett Senior, MD  naproxen (NAPROSYN) 500 MG tablet Take 1 tablet (500 mg total) by mouth 2 (two) times daily with a meal. 08/02/19   Dwyane Dupree, PA-C    Allergies    Penicillins  Review of Systems   Review of Systems  Musculoskeletal: Positive for arthralgias.  Neurological: Negative for numbness.    Physical Exam Updated Vital Signs BP (!) 141/76 (BP Location: Left Arm)   Pulse 83   Temp 98.3 F (36.8 C) (Oral)   Resp 15   SpO2 96%   Physical Exam Vitals and nursing note reviewed.  Constitutional:      General: He is not in acute distress.    Appearance: He is well-developed.  HENT:     Head: Normocephalic and atraumatic.  Pulmonary:     Effort: Pulmonary effort is normal.  Abdominal:     General: There is no distension.  Musculoskeletal:        General: Tenderness present. No swelling or deformity. Normal range of motion.     Cervical back: Normal range of motion.     Comments: No swelling or deformity.  Tenderness palpation over the extensor retinaculum.  No tenderness palpation  over the metacarpals.  Full active range of motion of the wrist and fingers without difficulty.  Grip strength equal bilaterally.  Radial pulses 2+ bilaterally.  Skin:    General: Skin is warm.     Capillary Refill: Capillary refill takes less than 2 seconds.     Findings: No rash.  Neurological:     Mental Status: He is alert and oriented to person, place, and time.     ED Results / Procedures / Treatments   Labs (all labs ordered are listed, but only abnormal results are displayed) Labs Reviewed - No data to display  EKG None  Radiology DG Wrist Complete Right  Result Date: 08/02/2019 CLINICAL DATA:  Right wrist pain. EXAM: RIGHT WRIST - COMPLETE 3+ VIEW COMPARISON:  June 17, 2012 FINDINGS: There is no evidence  of fracture or dislocation. A radiopaque fixation plate and screws are seen overlying the fifth right metacarpal. There is no evidence of arthropathy or other focal bone abnormality. Soft tissues are unremarkable. IMPRESSION: 1. Prior open reduction and internal fixation of the fifth right metacarpal, without evidence of an acute osseous abnormality. Electronically Signed   By: Virgina Norfolk M.D.   On: 08/02/2019 20:30   DG Hand Complete Right  Result Date: 08/02/2019 CLINICAL DATA:  Right hand pain. EXAM: RIGHT HAND - COMPLETE 3+ VIEW COMPARISON:  None. FINDINGS: There is no evidence of fracture or dislocation. The radiopaque fixation plate and screws are seen overlying the fifth right metacarpal. There is no evidence of arthropathy or other focal bone abnormality. Soft tissues are unremarkable. IMPRESSION: 1. Prior open reduction and internal fixation of the fifth right metacarpal, without evidence of an acute osseous abnormality. Electronically Signed   By: Virgina Norfolk M.D.   On: 08/02/2019 20:30    Procedures Procedures (including critical care time)  Medications Ordered in ED Medications - No data to display  ED Course  I have reviewed the triage vital signs and the nursing notes.  Pertinent labs & imaging results that were available during my care of the patient were reviewed by me and considered in my medical decision making (see chart for details).    MDM Rules/Calculators/A&P                      Patient presented for evaluation of right hand pain.  Physical exam shows patient who is neurovascularly intact.  While I have low suspicion for fracture, as patient has had persistent pain for a week and previous surgery, will obtain x-rays to ensure no bony abnormality.  X-rays viewed interpreted by me, no new fracture or dislocation.  As such, likely MSK.  Will have patient treat with NSAIDs, brace, ice.  Follow-up as needed.  At this time, patient appears safe for discharge.   Return precautions given.  Patient states he understands and agrees to plan.  Final Clinical Impression(s) / ED Diagnoses Final diagnoses:  Right hand pain    Rx / DC Orders ED Discharge Orders         Ordered    naproxen (NAPROSYN) 500 MG tablet  2 times daily with meals     08/02/19 2148           Franchot Heidelberg, PA-C 08/02/19 2150    Wyvonnia Dusky, MD 08/03/19 1213

## 2019-08-02 NOTE — ED Triage Notes (Signed)
Pt reports that he was lifting furniture last week and heard a pop in his R hand. States that pain has not improved. He is able to move is fingers and wrist. He states that he had surgery in the same hand years ago.

## 2019-08-02 NOTE — Discharge Instructions (Signed)
Take naproxen 2 times a day with meals.  Do not take other anti-inflammatories at the same time (Advil, Motrin, ibuprofen, Aleve). You may supplement with Tylenol if you need further pain control. Use ice packs or heating pads if this helps control your pain. Wear the brace as needed for pain. Follow-up with either your primary care doctor or hand doctor listed below as needed for further evaluation. Return to the emergency room if you develop severe worsening pain, redness and swelling of your wrist, inability to move your fingers, or any new, worsening, or concerning symptoms.

## 2019-08-02 NOTE — ED Notes (Signed)
Registration at bedside.

## 2020-11-25 IMAGING — CR DG WRIST COMPLETE 3+V*R*
4 series · 4 of 4 positions shown · non-contrast
Comparison: June 17, 2012

CLINICAL DATA: Right wrist pain.

EXAM:
RIGHT WRIST - COMPLETE 3+ VIEW

[x wrist pa right]
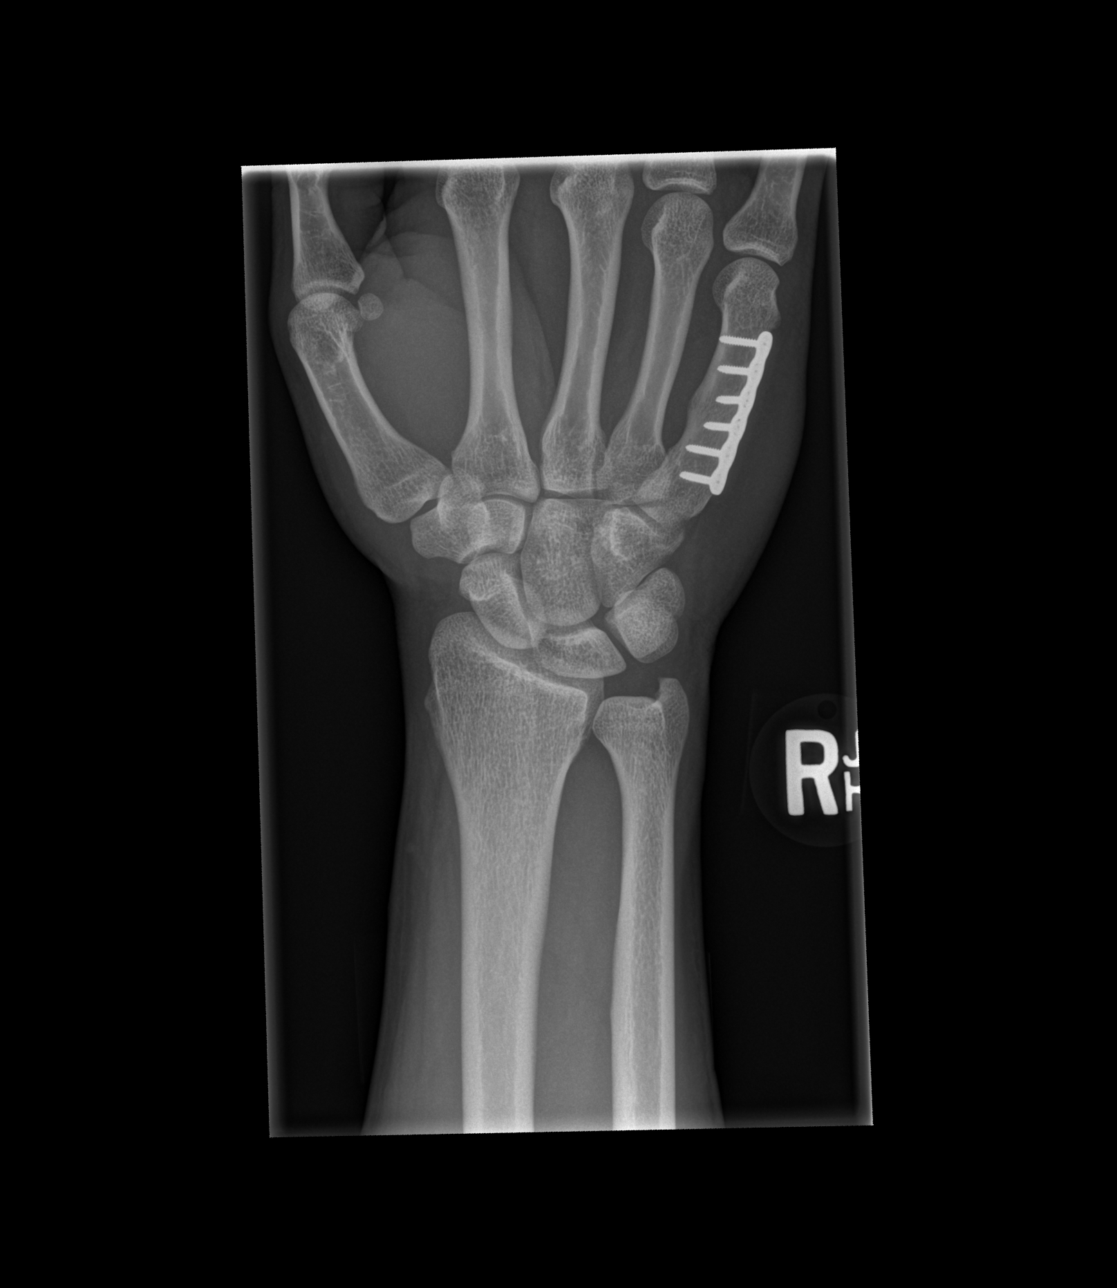

[x wrist obl right]
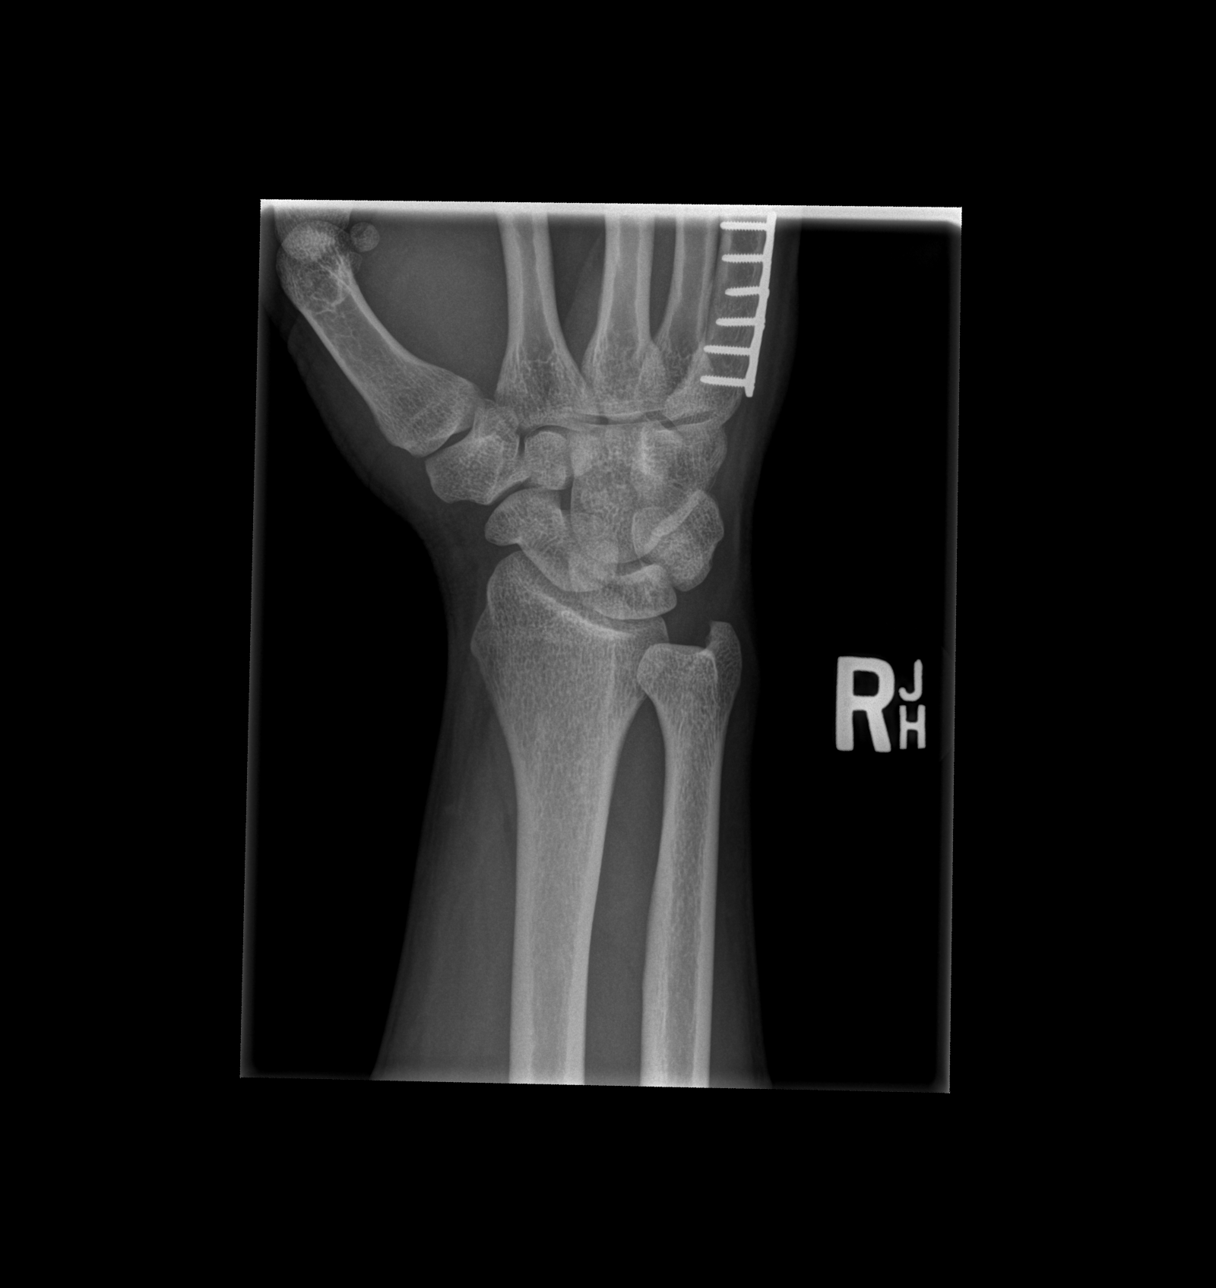

[x wrist lat right]
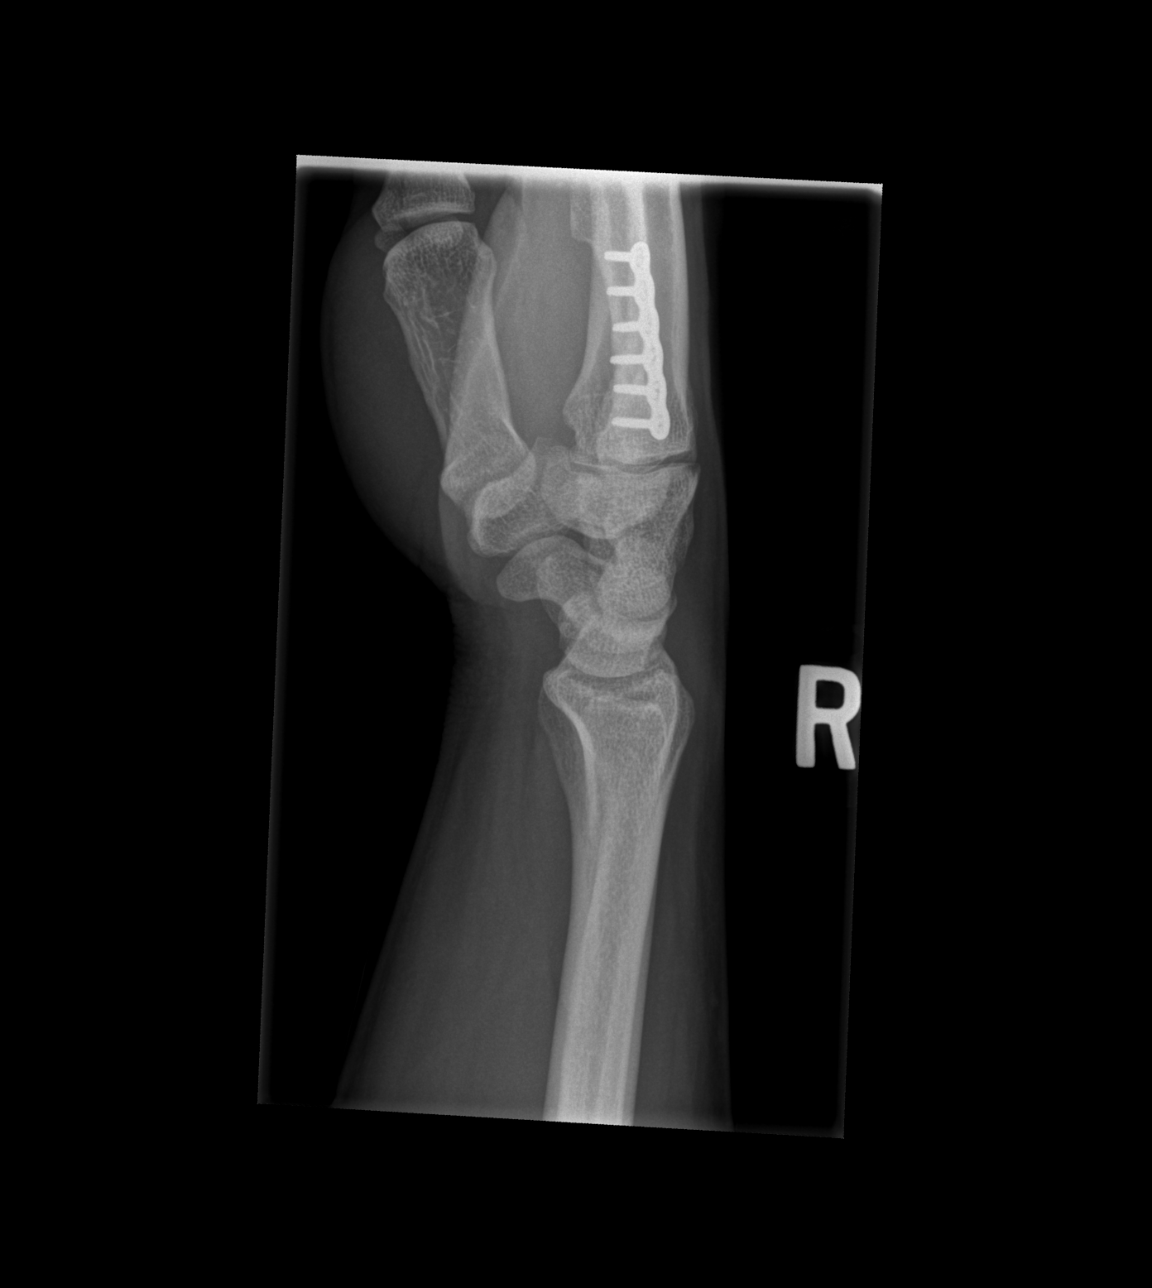

[x wrist navicular view right]
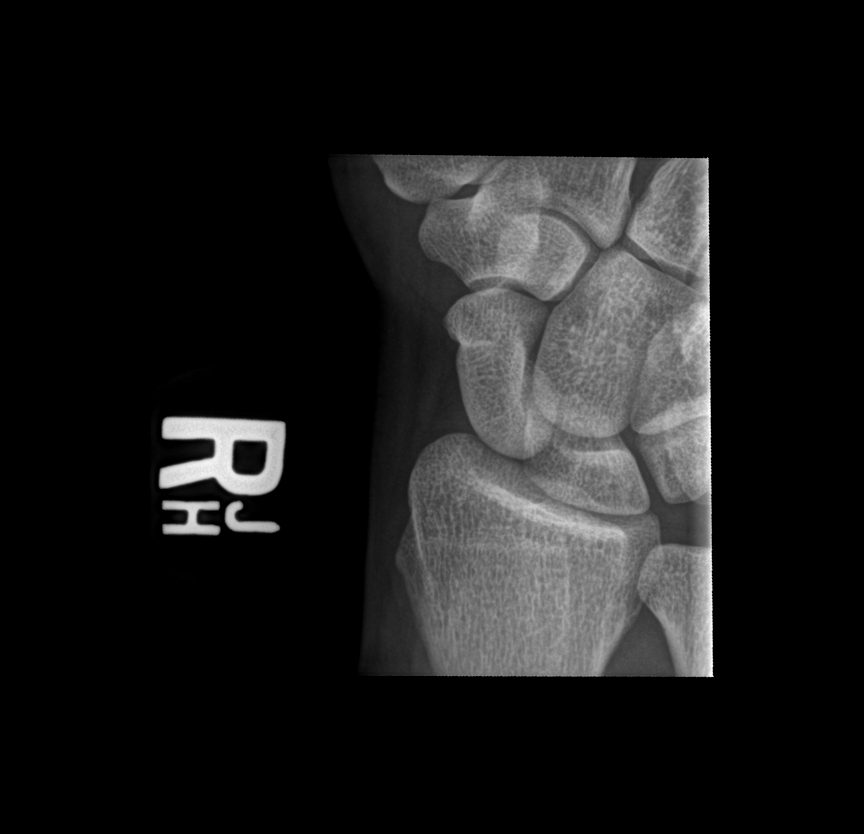

[4 of 4 positions shown; findings below may reference images not displayed]

FINDINGS: There is no evidence of fracture or dislocation. A radiopaque
fixation plate and screws are seen overlying the fifth right
metacarpal. There is no evidence of arthropathy or other focal bone
abnormality. Soft tissues are unremarkable.
IMPRESSION: 1. Prior open reduction and internal fixation of the fifth right
metacarpal, without evidence of an acute osseous abnormality.
